# Patient Record
Sex: Male | Born: 1985
Health system: Southern US, Community
[De-identification: ages and names within clinical notes are randomized; demographics above are authoritative.]

---

## 2009-12-03 ENCOUNTER — Emergency Department (HOSPITAL_COMMUNITY): Admission: EM | Admit: 2009-12-03 | Discharge: 2009-12-03 | Payer: Self-pay | Admitting: Emergency Medicine

## 2019-08-08 DIAGNOSIS — S46019A Strain of muscle(s) and tendon(s) of the rotator cuff of unspecified shoulder, initial encounter: Secondary | ICD-10-CM | POA: Diagnosis not present

## 2019-08-08 DIAGNOSIS — M25519 Pain in unspecified shoulder: Secondary | ICD-10-CM | POA: Diagnosis not present

## 2019-10-02 ENCOUNTER — Encounter (HOSPITAL_COMMUNITY): Payer: Self-pay

## 2019-10-02 ENCOUNTER — Other Ambulatory Visit: Payer: Self-pay

## 2019-10-02 ENCOUNTER — Emergency Department (HOSPITAL_COMMUNITY): Payer: BC Managed Care – PPO

## 2019-10-02 ENCOUNTER — Emergency Department (HOSPITAL_COMMUNITY)
Admission: EM | Admit: 2019-10-02 | Discharge: 2019-10-02 | Disposition: A | Discharge status: Home / Self Care | Payer: BC Managed Care – PPO | Attending: Emergency Medicine | Admitting: Emergency Medicine

## 2019-10-02 DIAGNOSIS — R072 Precordial pain: Secondary | ICD-10-CM | POA: Diagnosis not present

## 2019-10-02 DIAGNOSIS — R0781 Pleurodynia: Secondary | ICD-10-CM | POA: Diagnosis not present

## 2019-10-02 DIAGNOSIS — R0789 Other chest pain: Secondary | ICD-10-CM | POA: Diagnosis not present

## 2019-10-02 DIAGNOSIS — Z79899 Other long term (current) drug therapy: Secondary | ICD-10-CM | POA: Diagnosis not present

## 2019-10-02 MED ORDER — CYCLOBENZAPRINE HCL 10 MG PO TABS
5.0000 mg | ORAL_TABLET | Freq: Once | ORAL | Status: AC
  Administered 2019-10-02: 07:00:00 5 mg via ORAL
  Filled 2019-10-02: qty 1

## 2019-10-02 MED ORDER — IBUPROFEN 200 MG PO TABS
400.0000 mg | ORAL_TABLET | Freq: Once | ORAL | Status: AC
  Administered 2019-10-02: 400 mg via ORAL
  Filled 2019-10-02: qty 2

## 2019-10-02 MED ORDER — CYCLOBENZAPRINE HCL 5 MG PO TABS: 5.0000 mg | ORAL_TABLET | Freq: Three times a day (TID) | ORAL | 0 refills | Status: AC | PRN

## 2019-10-02 NOTE — Discharge Instructions (Addendum)
Your caregiver has diagnosed you as having chest pain that is not specific for one problem, but does not require admission.  Chest pain comes from many different causes.  SEEK IMMEDIATE MEDICAL ATTENTION IF: You have severe chest pain, especially if the pain is crushing or pressure-like and spreads to the arms, back, neck, or jaw, or if you have sweating, nausea (feeling sick to your stomach), or shortness of breath. THIS IS AN EMERGENCY. Don't wait to see if the pain will go away. Get medical help at once. Call 911 or [phone] (operator). DO NOT drive yourself to the hospital.  Your chest pain gets worse and does not go away with rest.  You have an attack of chest pain lasting longer than usual, despite rest and treatment with the medications your caregiver has prescribed.  You wake from sleep with chest pain or shortness of breath.  You feel dizzy or faint.  You have chest pain not typical of your usual pain for which you originally saw your caregiver.   You can take ibuprofen up to 3 times a day for a week.

## 2019-10-02 NOTE — ED Triage Notes (Signed)
Pt reports L sided rib pain that started yesterday. He states that it started when he got home from work and doesn't know of any injury. States that it is worse with movement and deep breathing. A&Ox4. Ambulatory.

## 2019-10-02 NOTE — ED Provider Notes (Signed)
[facility] DEPT Provider Note   CSN: [id number] Arrival date & time: 10/02/19  0201     History Chief Complaint  Patient presents with  . Rib Pain    Angel King is a 34 y.o. male.  The history is provided by the patient.  Chest Pain Pain location:  L chest and L lateral chest Pain quality: sharp   Pain radiates to:  Upper back Pain severity:  Moderate Onset quality:  Sudden Duration:  24 hours Timing:  Intermittent Progression:  Worsening Chronicity:  New Relieved by:  Nothing Worsened by:  Certain positions, coughing, deep breathing and movement Associated symptoms: shortness of breath   Associated symptoms: no abdominal pain, no cough, no fever, no syncope, no vomiting and no weakness   Associated symptoms comment:  Denies hemoptysis Risk factors: no coronary artery disease and no prior DVT/PE    Patient reports side left chest/rib pain over 24 hours ago.  This occurred after  work.  Denies any new trauma, but he does significant lifting at work.  Reports mild shortness of breath.  No fevers or vomiting.  No hemoptysis.  No dyspnea on exertion.  He has never had this before.  It hurts worse with movement and deep breathing     PMH-none Soc hx - smokes tobacco fam hx - negative for premature CAD Social History   Tobacco Use  . Smoking status: Not on file  Substance Use Topics  . Alcohol use: Not on file  . Drug use: Not on file    Home Medications Prior to Admission medications   Medication Sig Start Date End Date Taking? Authorizing Provider  acetaminophen (TYLENOL) 325 MG tablet Take 650 mg by mouth every 6 (six) hours as needed for mild pain.   Yes [provider]    Allergies    Patient has no known allergies.  Review of Systems   Review of Systems  Constitutional: Negative for fever.  Respiratory: Positive for shortness of breath. Negative for cough.   Cardiovascular: Positive for chest pain. Negative  for leg swelling and syncope.  Gastrointestinal: Negative for abdominal pain and vomiting.  Neurological: Negative for weakness.  All other systems reviewed and are negative.   Physical Exam Updated Vital Signs BP 119/80 (BP Location: Left Arm)   Pulse 91   Temp 98.3 F (36.8 C) (Oral)   Resp 15   Ht 1.753 m (5\' 9" )   Wt 72.6 kg   SpO2 99%   BMI 23.63 kg/m   Physical Exam CONSTITUTIONAL: Well developed/well nourished HEAD: Normocephalic/atraumatic EYES: EOMI/PERRL ENMT: Mucous membranes moist NECK: supple no meningeal signs SPINE/BACK:entire spine nontender CV: S1/S2 noted, no murmurs/rubs/gallops noted LUNGS: Lungs are clear to auscultation bilaterally, no apparent distress Chest-mild left-sided chest wall tenderness.  No crepitus.  Pain is exacerbated with twisting of torso ABDOMEN: soft, nontender, no rebound or guarding, bowel sounds noted throughout abdomen GU:no cva tenderness NEURO: Pt is awake/alert/appropriate, moves all extremitiesx4.  No facial droop.   EXTREMITIES: pulses normal/equal, full ROM, no lower extremity edema or tenderness SKIN: warm, color normal PSYCH: no abnormalities of mood noted, alert and oriented to situation  ED Results / Procedures / Treatments   Labs (all labs ordered are listed, but only abnormal results are displayed) Labs Reviewed - No data to display  EKG EKG Interpretation  Date/Time:  Sunday October 02 2019 05:29:12 EST Ventricular Rate:  82 PR Interval:    QRS Duration: 91 QT Interval:  355 QTC  Calculation: 415 R Axis:   119 Text Interpretation: Sinus rhythm Consider right atrial enlargement Consider right ventricular hypertrophy Baseline wander in lead(s) V2 No previous ECGs available Confirmed by Zadie Rhine ([id number]) on 10/02/2019 5:32:40 AM   Radiology DG Ribs Unilateral W/Chest Left  Result Date: 10/02/2019 CLINICAL DATA:  Left lower anterior rib pain EXAM: LEFT RIBS AND CHEST - 3+ VIEW COMPARISON:  None.  FINDINGS: No fracture or other bone lesions are seen involving the ribs. There is no evidence of pneumothorax or pleural effusion. There is streaky airspace opacity seen at the left lung base. Heart size and mediastinal contours are within normal limits. IMPRESSION: No acute osseous injury. Streaky opacity at the left lung base could be due to atelectasis and/or early infectious etiology. Electronically Signed   By: Jonna Clark M.D.   On: 10/02/2019 02:32    Procedures Procedures  Medications Ordered in ED Medications  ibuprofen (ADVIL) tablet 400 mg (400 mg Oral Given 10/02/19 0630)  cyclobenzaprine (FLEXERIL) tablet 5 mg (5 mg Oral Given 10/02/19 0630)    ED Course  I have reviewed the triage vital signs and the nursing notes.  Pertinent  imaging results that were available during my care of the patient were reviewed by me and considered in my medical decision making (see chart for details).    MDM Rules/Calculators/A&P                      PT Well-appearing.  Low risk for CAD.  He appears PERC negative Denies fever/cough, low suspicion for pneumonia Pain is exacerbated by movement and palpation.  Suspect chest wall strain/spasm  Patient well-appearing.  No hypoxia.  Will discharge home.  Suspect muscle strain We discussed return precautions Final Clinical Impression(s) / ED Diagnoses Final diagnoses:  Precordial pain    Rx / DC Orders ED Discharge Orders         Ordered    cyclobenzaprine (FLEXERIL) 5 MG tablet  3 times daily PRN     10/02/19 0700           Zadie Rhine, MD 10/02/19 [phone]

## 2019-10-18 DIAGNOSIS — R5383 Other fatigue: Secondary | ICD-10-CM | POA: Diagnosis not present

## 2019-10-18 DIAGNOSIS — Z20822 Contact with and (suspected) exposure to covid-19: Secondary | ICD-10-CM | POA: Diagnosis not present

## 2019-11-21 ENCOUNTER — Ambulatory Visit: Payer: BC Managed Care – PPO | Attending: Internal Medicine

## 2019-11-21 DIAGNOSIS — Z20822 Contact with and (suspected) exposure to covid-19: Secondary | ICD-10-CM | POA: Diagnosis not present

## 2019-11-22 LAB — NOVEL CORONAVIRUS, NAA: SARS-CoV-2, NAA: NOT DETECTED

## 2020-06-14 IMAGING — CR DG RIBS W/ CHEST 3+V*L*
4 series · 4 of 4 positions shown · non-contrast
Comparison: None.

CLINICAL DATA: Left lower anterior rib pain

EXAM:
LEFT RIBS AND CHEST - 3+ VIEW

[w chest pa (1 of 4)]
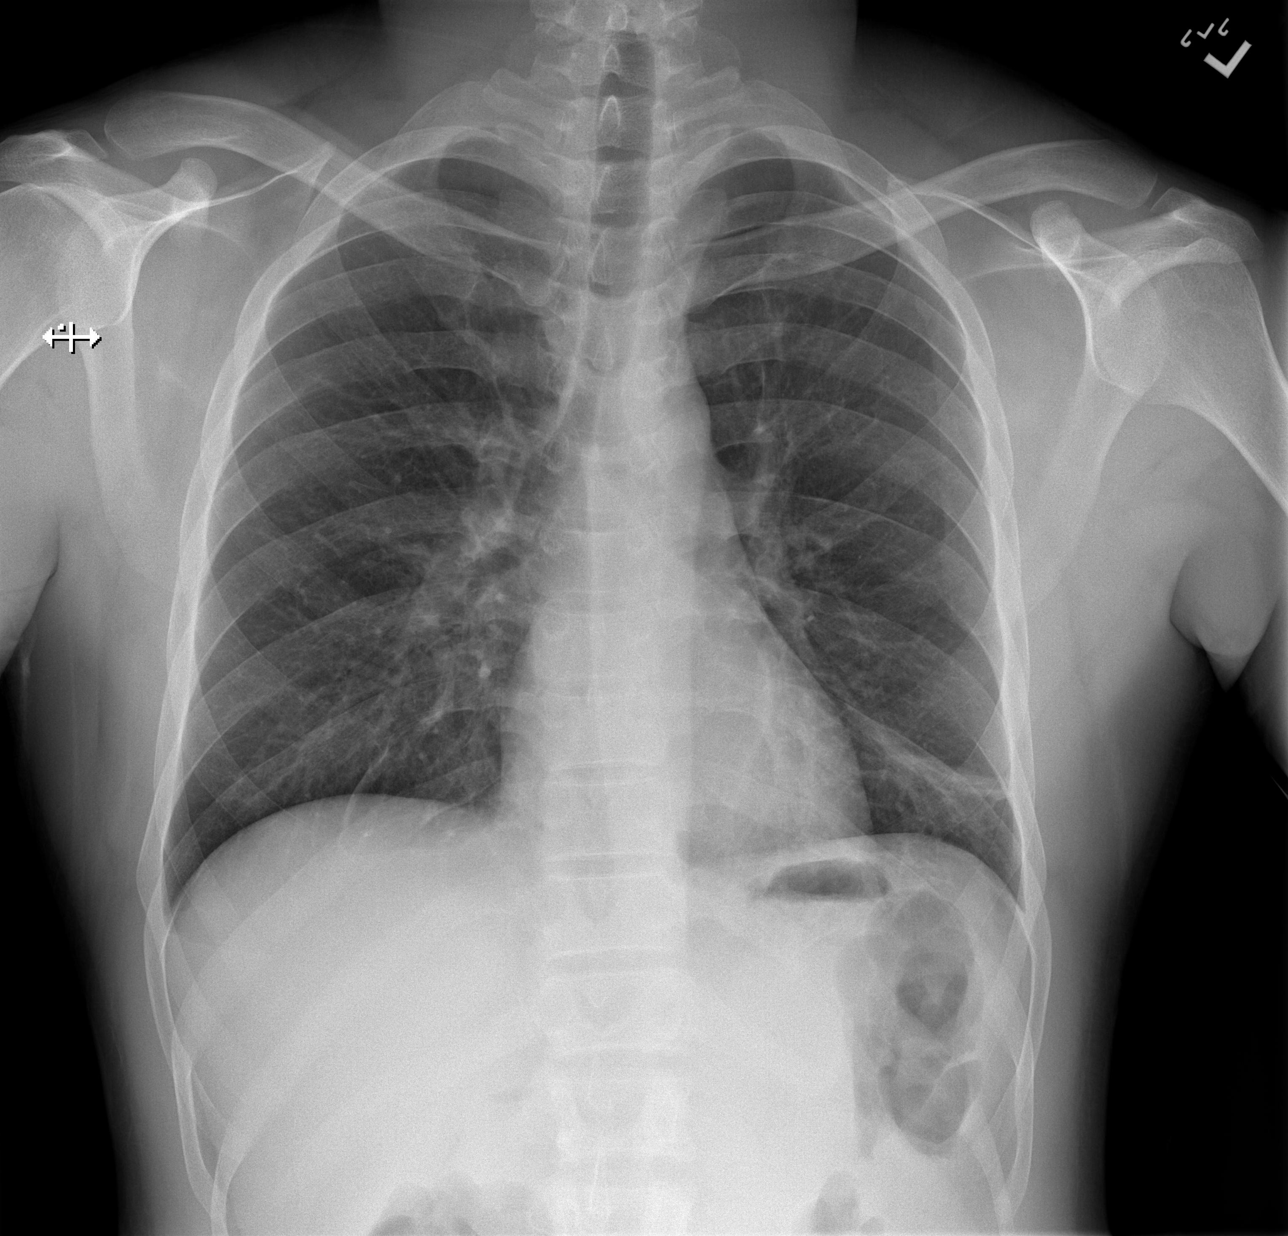

[w chest pa (2 of 4)]
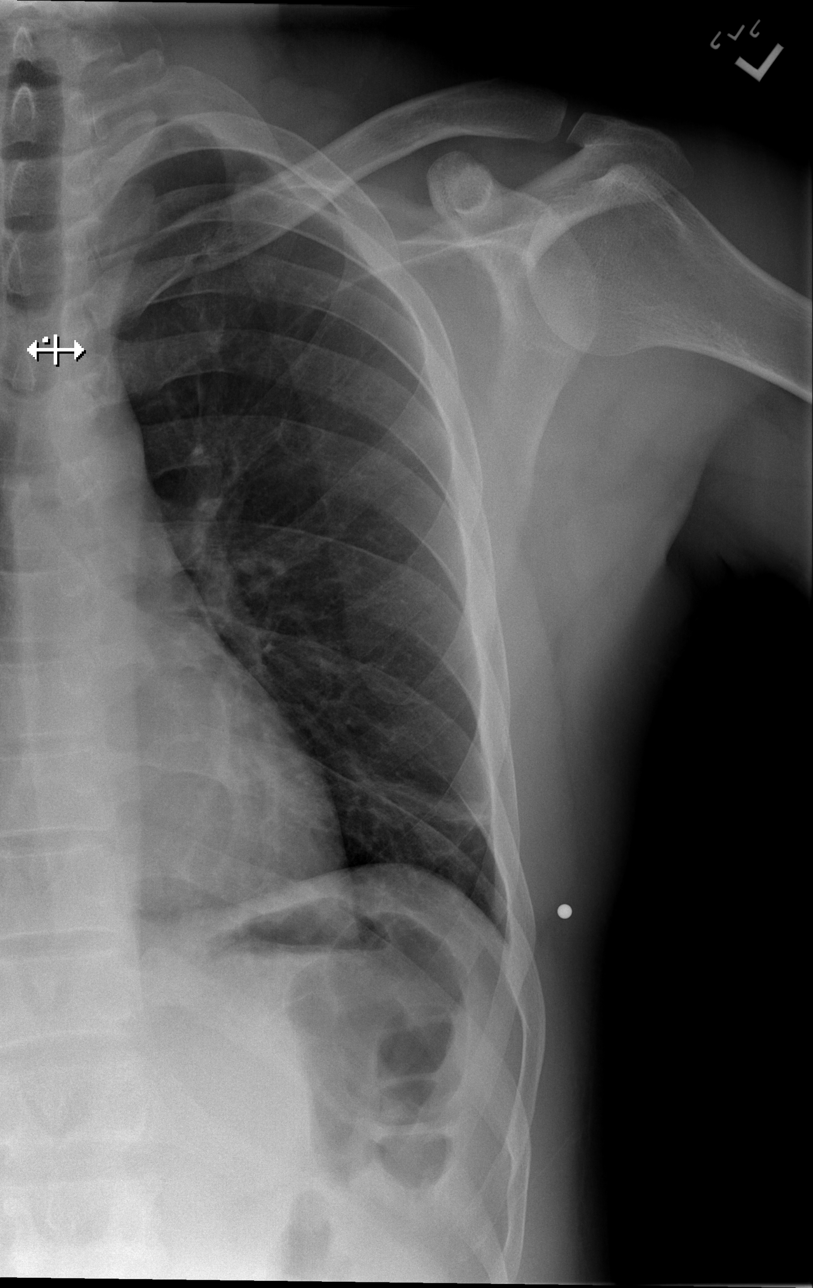

[w chest pa (3 of 4)]
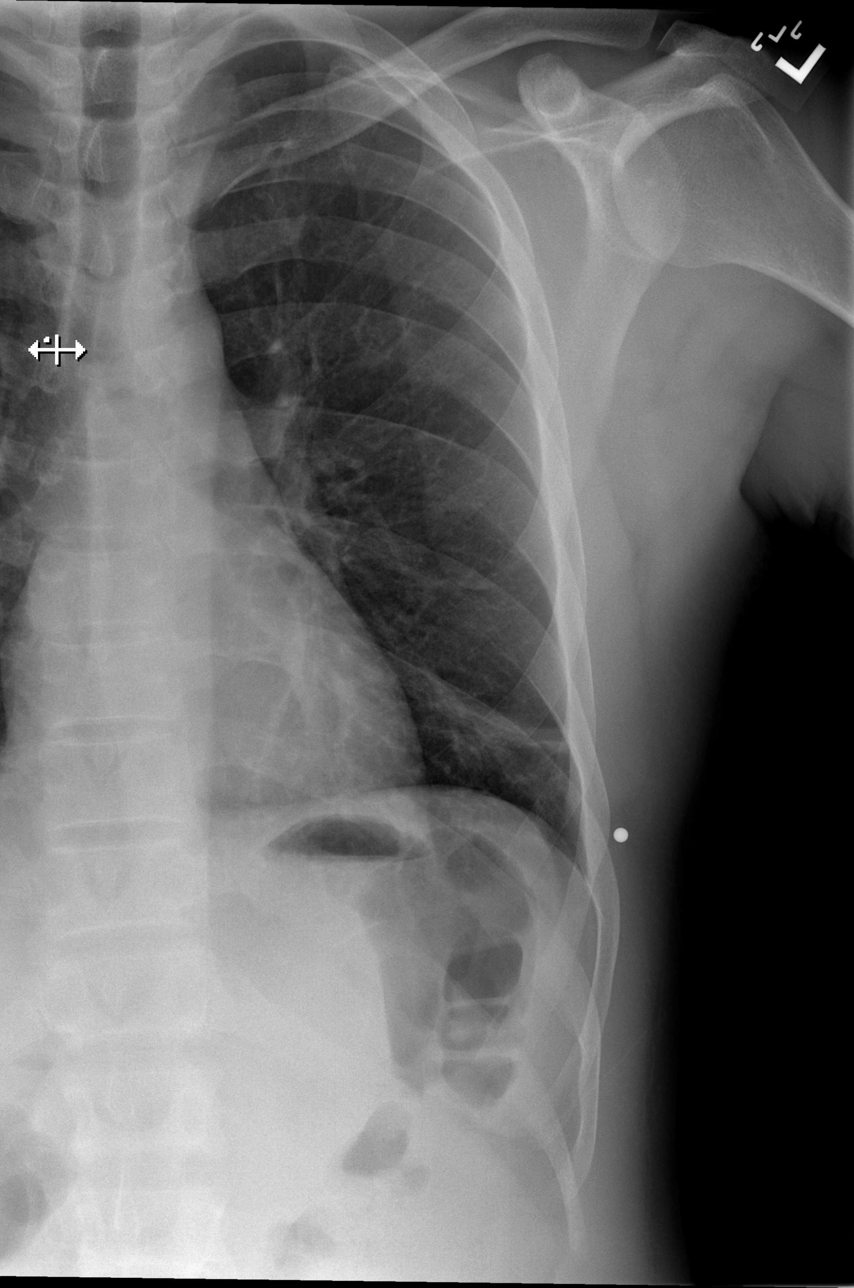

[w chest pa (4 of 4)]
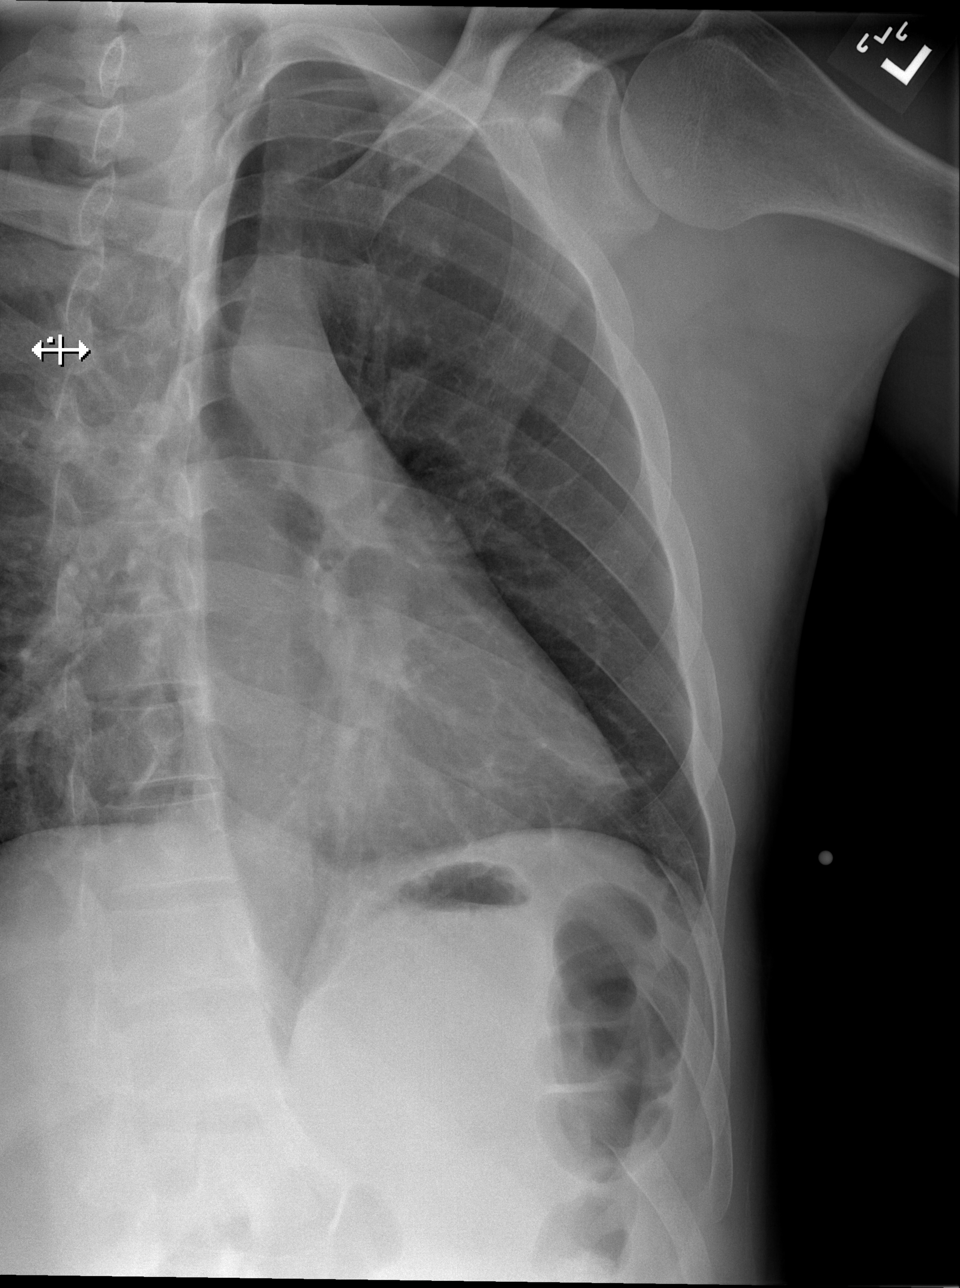

[4 of 4 positions shown; findings below may reference images not displayed]

FINDINGS: No fracture or other bone lesions are seen involving the ribs. There
is no evidence of pneumothorax or pleural effusion. There is streaky
airspace opacity seen at the left lung base. Heart size and
mediastinal contours are within normal limits.
IMPRESSION: No acute osseous injury.

Streaky opacity at the left lung base could be due to atelectasis
and/or early infectious etiology.

## 2020-08-18 ENCOUNTER — Other Ambulatory Visit: Payer: Self-pay

## 2020-08-18 ENCOUNTER — Other Ambulatory Visit: Payer: BC Managed Care – PPO

## 2020-08-18 DIAGNOSIS — Z20822 Contact with and (suspected) exposure to covid-19: Secondary | ICD-10-CM | POA: Diagnosis not present

## 2020-08-19 LAB — NOVEL CORONAVIRUS, NAA: SARS-CoV-2, NAA: NOT DETECTED

## 2020-08-19 LAB — SARS-COV-2, NAA 2 DAY TAT

## 2020-09-29 DIAGNOSIS — Z1152 Encounter for screening for COVID-19: Secondary | ICD-10-CM | POA: Diagnosis not present
